# Patient Record
Sex: Female | Born: 2005 | Race: Black or African American | Hispanic: No | Marital: Single | Smoking: Never smoker
Health system: Southern US, Community
[De-identification: ages and names within clinical notes are randomized; demographics above are authoritative.]

## PROBLEM LIST (undated history)

## (undated) ENCOUNTER — Emergency Department (HOSPITAL_COMMUNITY)

## (undated) ENCOUNTER — Inpatient Hospital Stay (HOSPITAL_COMMUNITY): Payer: Self-pay

## (undated) DIAGNOSIS — J45909 Unspecified asthma, uncomplicated: Secondary | ICD-10-CM

---
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
## 2024-01-19 ENCOUNTER — Encounter (HOSPITAL_COMMUNITY): Payer: Self-pay | Admitting: Obstetrics and Gynecology

## 2024-01-19 ENCOUNTER — Inpatient Hospital Stay (HOSPITAL_COMMUNITY)
Admission: AD | Admit: 2024-01-19 | Discharge: 2024-01-19 | Disposition: A | Discharge status: Home / Self Care | Attending: Obstetrics and Gynecology | Admitting: Obstetrics and Gynecology

## 2024-01-19 ENCOUNTER — Other Ambulatory Visit: Payer: Self-pay

## 2024-01-19 ENCOUNTER — Inpatient Hospital Stay (HOSPITAL_COMMUNITY)

## 2024-01-19 DIAGNOSIS — Z3491 Encounter for supervision of normal pregnancy, unspecified, first trimester: Secondary | ICD-10-CM

## 2024-01-19 DIAGNOSIS — O418X10 Other specified disorders of amniotic fluid and membranes, first trimester, not applicable or unspecified: Secondary | ICD-10-CM

## 2024-01-19 DIAGNOSIS — O468X1 Other antepartum hemorrhage, first trimester: Secondary | ICD-10-CM | POA: Diagnosis not present

## 2024-01-19 DIAGNOSIS — Z3A08 8 weeks gestation of pregnancy: Secondary | ICD-10-CM | POA: Diagnosis not present

## 2024-01-19 LAB — URINALYSIS, ROUTINE W REFLEX MICROSCOPIC
Bilirubin Urine: NEGATIVE
Glucose, UA: NEGATIVE mg/dL
Ketones, ur: 5 mg/dL — AB
Nitrite: NEGATIVE
Protein, ur: 30 mg/dL — AB
Specific Gravity, Urine: 1.025 (ref 1.005–1.030)
pH: 5 (ref 5.0–8.0)

## 2024-01-19 LAB — ABO/RH: ABO/RH(D): O POS

## 2024-01-19 LAB — HCG, QUANTITATIVE, PREGNANCY: hCG, Beta Chain, Quant, S: 84357 m[IU]/mL — ABNORMAL HIGH (ref <5)

## 2024-01-19 LAB — WET PREP, GENITAL
Clue Cells Wet Prep HPF POC: NONE SEEN
Sperm: NONE SEEN
Trich, Wet Prep: NONE SEEN
WBC, Wet Prep HPF POC: <10 (ref <10)
Yeast Wet Prep HPF POC: NONE SEEN

## 2024-01-19 LAB — CBC
HCT: 35.9 % — ABNORMAL LOW (ref 36.0–46.0)
Hemoglobin: 11.3 g/dL — ABNORMAL LOW (ref 12.0–15.0)
MCH: 23.3 pg — ABNORMAL LOW (ref 26.0–34.0)
MCHC: 31.5 g/dL (ref 30.0–36.0)
MCV: 74 fL — ABNORMAL LOW (ref 80.0–100.0)
Platelets: 401 K/uL — ABNORMAL HIGH (ref 150–400)
RBC: 4.85 MIL/uL (ref 3.87–5.11)
RDW: 18.5 % — ABNORMAL HIGH (ref 11.5–15.5)
WBC: 9 K/uL (ref 4.0–10.5)
nRBC: 0 % (ref 0.0–0.2)

## 2024-01-19 LAB — POCT PREGNANCY, URINE: Preg Test, Ur: POSITIVE — AB

## 2024-01-19 NOTE — MAU Provider Note (Signed)
 History     CSN: [id number]  Arrival date and time: 01/19/24 1541   Event Date/Time   First Provider Initiated Contact with Patient 01/19/24 1916      Chief Complaint  Patient presents with   Abdominal Pain   Vaginal Bleeding   HPI Alexandra Meyer is a 18 y.o. G1P0 at [redacted]w[redacted]d who presents with vaginal bleeding & intermittent abdominal cramping. Reports vaginal spotting today. Has had intermittent cramping this week. Denies n/v/d, dysuria, vaginal discharge, or constipation.  Has appt with PCP next week for pregnancy confirmation & OB/gyn referral.   OB History     Gravida  1   Para      Term      Preterm      AB      Living         SAB      IAB      Ectopic      Multiple      Live Births              Past Medical History:  Diagnosis Date   Asthma     Past Surgical History:  Procedure Laterality Date   NO PAST SURGERIES      No family history on file.  Social History[1]  Allergies: Allergies[2]  Medications Prior to Admission  Medication Sig Dispense Refill Last Dose/Taking   predniSONE  (DELTASONE ) 10 MG tablet Take 2 tablets (20 mg total) by mouth daily. 6 tablet 0 Past Month   celecoxib  (CELEBREX ) 100 MG capsule Take 1 capsule (100 mg total) by mouth 2 (two) times daily. 15 capsule 0 Unknown   cyclobenzaprine  (FLEXERIL ) 10 MG tablet Take 1 tablet (10 mg total) by mouth 2 (two) times daily as needed for muscle spasms. 20 tablet 0 Unknown    Review of Systems  All other systems reviewed and are negative.  Physical Exam   Blood pressure (!) 132/90, pulse 65, temperature 98.9 F (37.2 C), temperature source Oral, resp. rate 18, height 5' 4 (1.626 m), weight 93 kg, last menstrual period 06/28/2023, SpO2 100%.  Physical Exam Vitals and nursing note reviewed.  Constitutional:      General: She is not in acute distress.    Appearance: She is well-developed. She is not ill-appearing.  HENT:     Head: Normocephalic and atraumatic.  Eyes:      General: No scleral icterus.       Right eye: No discharge.        Left eye: No discharge.     Conjunctiva/sclera: Conjunctivae normal.  Pulmonary:     Effort: Pulmonary effort is normal. No respiratory distress.  Neurological:     General: No focal deficit present.     Mental Status: She is alert.  Psychiatric:        Mood and Affect: Mood normal.        Behavior: Behavior normal.     MAU Course  Procedures Results for orders placed or performed during the hospital encounter of 01/19/24 (from the past 24 hours)  Pregnancy, urine POC     Status: Abnormal   Collection Time: 01/19/24  4:54 PM  Result Value Ref Range   Preg Test, Ur POSITIVE (A) NEGATIVE  Urinalysis, Routine w reflex microscopic -Urine, Clean Catch     Status: Abnormal   Collection Time: 01/19/24  4:59 PM  Result Value Ref Range   Color, Urine AMBER (A) YELLOW   APPearance CLOUDY (A) CLEAR   Specific Gravity,  Urine 1.025 1.005 - 1.030   pH 5.0 5.0 - 8.0   Glucose, UA NEGATIVE NEGATIVE mg/dL   Hgb urine dipstick LARGE (A) NEGATIVE   Bilirubin Urine NEGATIVE NEGATIVE   Ketones, ur 5 (A) NEGATIVE mg/dL   Protein, ur 30 (A) NEGATIVE mg/dL   Nitrite NEGATIVE NEGATIVE   Leukocytes,Ua MODERATE (A) NEGATIVE   RBC / HPF 6-10 0 - 5 RBC/hpf   WBC, UA 21-50 0 - 5 WBC/hpf   Bacteria, UA RARE (A) NONE SEEN   Squamous Epithelial / HPF 21-50 0 - 5 /HPF   Mucus PRESENT   Wet prep, genital     Status: None   Collection Time: 01/19/24  5:09 PM   Specimen: PATH Cytology Cervicovaginal Ancillary Only  Result Value Ref Range   Yeast Wet Prep HPF POC NONE SEEN NONE SEEN   Trich, Wet Prep NONE SEEN NONE SEEN   Clue Cells Wet Prep HPF POC NONE SEEN NONE SEEN   WBC, Wet Prep HPF POC <10 <10   Sperm NONE SEEN   ABO/Rh     Status: None   Collection Time: 01/19/24  5:13 PM  Result Value Ref Range   ABO/RH(D) O POS    No rh immune globuloin      NOT A RH IMMUNE GLOBULIN CANDIDATE, PT RH POSITIVE Performed at [facility], [address], [place], [place] [address]   CBC     Status: Abnormal   Collection Time: 01/19/24  5:15 PM  Result Value Ref Range   WBC 9.0 4.0 - 10.5 K/uL   RBC 4.85 3.87 - 5.11 MIL/uL   Hemoglobin 11.3 (L) 12.0 - 15.0 g/dL   HCT 64.0 (L) 63.9 - 53.9 %   MCV 74.0 (L) 80.0 - 100.0 fL   MCH 23.3 (L) 26.0 - 34.0 pg   MCHC 31.5 30.0 - 36.0 g/dL   RDW 81.4 (H) 88.4 - 84.4 %   Platelets 401 (H) 150 - 400 K/uL   nRBC 0.0 0.0 - 0.2 %  hCG, quantitative, pregnancy     Status: Abnormal   Collection Time: 01/19/24  5:15 PM  Result Value Ref Range   hCG, Beta Chain, Quant, S 84,357 (H) <5 mIU/mL   US  OB LESS THAN 14 WEEKS WITH OB TRANSVAGINAL Result Date: 01/19/2024 CLINICAL DATA:  Vaginal bleeding and cramping in 1st trimester pregnancy. EXAM: OBSTETRIC <14 WK US  AND TRANSVAGINAL OB US  TECHNIQUE: Both transabdominal and transvaginal ultrasound examinations were performed for complete evaluation of the gestation as well as the maternal uterus, adnexal regions, and pelvic cul-de-sac. Transvaginal technique was performed to assess early pregnancy. COMPARISON:  None Available. FINDINGS: Intrauterine gestational sac: Single Yolk sac:  Visualized. Embryo:  Visualized. Cardiac Activity: Visualized. Heart Rate: 178 bpm CRL:  18 mm   8 w   2 d                  US  EDC: 08/28/2024 Subchorionic hemorrhage: Small subchorionic hemorrhage versus focal myometrial contraction along fundal aspect gestational sac Maternal uterus/adnexae: Both ovaries are normal in appearance. No mass or abnormal free fluid identified. IMPRESSION: Single living IUP with estimated gestational age of [redacted] weeks 2 days. Electronically Signed   By: Norleen DELENA Kil M.D.   On: 01/19/2024 18:51    MDM   Assessment and Plan   1. Normal IUP (intrauterine pregnancy) on prenatal ultrasound, first trimester   2. Subchorionic hematoma in first trimester, single or unspecified fetus   3. [redacted]  weeks gestation of pregnancy    -Ultrasound shows  live IUP. EDD updated.  Small subchorionic hemorrhage.  -GC/CT pending. Wet prep negative -RH positive -Reviewed bleeding precautions & reasons to return to MAU -Given list of prenatal providers  Rocky Satterfield 01/19/2024, 7:45 PM      [1]  Social History Tobacco Use   Smoking status: Never   Smokeless tobacco: Never  Vaping Use   Vaping status: Never Used  Substance Use Topics   Alcohol use: Never   Drug use: Never  [2] No Known Allergies

## 2024-01-19 NOTE — MAU Note (Signed)
 Alexandra Meyer is a 18 y.o. at [redacted]w[redacted]d here in MAU reporting: ongoing cramping  Was at work today from 10-3p and was cramping and felt discharge coming out. Went to the bathroom and saw bleeding that was red/pink in color. Denies any clots present. Patient denies any unusual vaginal discharge, vaginal odor, itching or pain. Sexual intercourse 2-3 days ago. Patient denies any pain with urination or urinary frequency. Has not taken any medication for cramping.   Onset of complaint: today Pain score: abd 7  Vitals:   01/19/24 1822  BP: (!) 132/90  Pulse: 65  Resp: 18  Temp: 98.9 F (37.2 C)  SpO2: 100%     FHT:n/a Lab orders placed from triage:  CUBA workup

## 2024-01-19 NOTE — Discharge Instructions (Signed)
  [facility] [employer] [facility] [employer] at [facility]  [address], [place], [place] [address]  [phone]  Center for [facility] at [facility]  [address], [place], [place] [address]  [phone]  Center for [facility] at [facility] [address], [place], [place] [address]  [phone]  Center for [facility] at [facility] [address], [place], [place] [address] [phone]  Center for [facility] at [facility]  [address] [place], [place] [address]  [phone]  Center for [facility] at [facility]  [address] (First floor), [place], [place] [address]  [phone]  Center for [facility] at [facility]  [address] [place], [place], [place] [address]  [phone]  [facility]  [address], [place], [place] [address]  [phone]  [facility]  [address] [place], [place], [place] [address]  [phone]  [facility]  [address] [place], [place] [address]  [phone]  [facility]  [address], [place], [place] [address]  [phone]  [facility]   [address], [place] [address]  [phone]  [facility]  [address] [place], [place] [address]   [phone]  [facility] [address], [place], [place] [address] [phone]  [facility]  [address], [place], [place] [address]  [phone]

## 2024-01-22 LAB — GC/CHLAMYDIA PROBE AMP (~~LOC~~) NOT AT ARMC
Chlamydia: NEGATIVE
Comment: NEGATIVE
Comment: NORMAL
Neisseria Gonorrhea: NEGATIVE

## 2024-03-15 ENCOUNTER — Ambulatory Visit: Admitting: Physician Assistant

## 2024-03-15 ENCOUNTER — Encounter: Payer: Self-pay | Admitting: Physician Assistant

## 2024-03-15 DIAGNOSIS — M5116 Intervertebral disc disorders with radiculopathy, lumbar region: Secondary | ICD-10-CM | POA: Insufficient documentation
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
## 2024-03-15 NOTE — Addendum Note (Signed)
 Addended by: GLADIS JACOBSEN on: 03/15/2024 10:11 AM   Modules accepted: Orders
